# Patient Record
Sex: Male | Born: 1965 | Race: White | Hispanic: No | Marital: Married | Smoking: Never smoker
Health system: Southern US, Community
[De-identification: ages and names within clinical notes are randomized; demographics above are authoritative.]

## PROBLEM LIST (undated history)

## (undated) DIAGNOSIS — D689 Coagulation defect, unspecified: Secondary | ICD-10-CM

## (undated) DIAGNOSIS — K589 Irritable bowel syndrome without diarrhea: Secondary | ICD-10-CM

## (undated) DIAGNOSIS — I82409 Acute embolism and thrombosis of unspecified deep veins of unspecified lower extremity: Secondary | ICD-10-CM

## (undated) DIAGNOSIS — E7800 Pure hypercholesterolemia, unspecified: Secondary | ICD-10-CM

## (undated) HISTORY — DX: Coagulation defect, unspecified: D68.9

## (undated) HISTORY — DX: Irritable bowel syndrome, unspecified: K58.9

## (undated) HISTORY — DX: Pure hypercholesterolemia, unspecified: E78.00

---

## 2012-06-20 HISTORY — PX: COLONOSCOPY: SHX174

## 2013-12-05 ENCOUNTER — Other Ambulatory Visit: Payer: Self-pay | Admitting: Family Medicine

## 2013-12-05 DIAGNOSIS — R609 Edema, unspecified: Secondary | ICD-10-CM

## 2013-12-05 DIAGNOSIS — I82409 Acute embolism and thrombosis of unspecified deep veins of unspecified lower extremity: Secondary | ICD-10-CM

## 2013-12-08 ENCOUNTER — Ambulatory Visit
Admission: RE | Admit: 2013-12-08 | Discharge: 2013-12-08 | Disposition: A | Discharge status: Home / Self Care | Payer: BC Managed Care – PPO | Source: Ambulatory Visit | Attending: Family Medicine | Admitting: Family Medicine

## 2013-12-08 DIAGNOSIS — I82409 Acute embolism and thrombosis of unspecified deep veins of unspecified lower extremity: Secondary | ICD-10-CM

## 2014-10-23 ENCOUNTER — Other Ambulatory Visit: Payer: Self-pay | Admitting: Family Medicine

## 2014-10-23 DIAGNOSIS — I87001 Postthrombotic syndrome without complications of right lower extremity: Secondary | ICD-10-CM

## 2014-10-26 ENCOUNTER — Ambulatory Visit
Admission: RE | Admit: 2014-10-26 | Discharge: 2014-10-26 | Disposition: A | Discharge status: Home / Self Care | Payer: BLUE CROSS/BLUE SHIELD | Source: Ambulatory Visit | Attending: Family Medicine | Admitting: Family Medicine

## 2014-10-26 DIAGNOSIS — I87001 Postthrombotic syndrome without complications of right lower extremity: Secondary | ICD-10-CM

## 2017-11-17 ENCOUNTER — Encounter: Payer: Self-pay | Admitting: Gastroenterology

## 2018-01-03 ENCOUNTER — Encounter: Payer: Self-pay | Admitting: Gastroenterology

## 2018-01-04 ENCOUNTER — Ambulatory Visit (INDEPENDENT_AMBULATORY_CARE_PROVIDER_SITE_OTHER): Payer: BLUE CROSS/BLUE SHIELD | Admitting: Gastroenterology

## 2018-01-04 ENCOUNTER — Encounter: Payer: Self-pay | Admitting: Gastroenterology

## 2018-01-04 VITALS — BP 118/88 | HR 88 | Ht 70.0 in | Wt 208.1 lb

## 2018-01-04 DIAGNOSIS — Z86010 Personal history of colonic polyps: Secondary | ICD-10-CM

## 2018-01-04 NOTE — Progress Notes (Signed)
Chief Complaint: Follow-up for colonoscopy  Referring Provider: Dr. Alvester Morin      ASSESSMENT AND PLAN;   #1. Colorectal cancer screening/H/O adenomatous polyps. - Proceed with colonoscopy.  I have discussed the risks and benefits.  The risks including risk of perforation requiring laparotomy, bleeding after polypectomy requiring blood transfusions and risks of anesthesia/sedation were discussed.  Rare risks of missing colorectal neoplasms were also discussed.  Alternatives were given.  Patient is fully aware and agrees to proceed. All the questions were answered. Colonoscopy will be scheduled in upcoming days.  Patient is to report immediately if there is any significant weight loss or excessive bleeding until then. Consent forms were given for review.   #2.  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 Low but real risk of recurrent DVT/PE.    HPI:    Richard Meyers is a 52 y.o. male   No GI complaints History of adenomatous polyps, due for repeat colonoscopy No nausea, vomiting, heartburn, regurgitation, odynophagia or dysphagia.  No significant diarrhea (has history of IBS with diarrhea) or constipation.  There is no melena or hematochezia. No unintentional weight loss. 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  No history of PE  Past GI procedures: Colonoscopy 06/20/2012- PCF, colonic polyp status post polypectomy, biopsies tubular adenomas, small internal hemorrhoids otherwise normal colonoscopy to TI   Past Medical History:  Diagnosis Date  . Blood clotting disorder (HCC)   . Hypercholesteremia   . IBS (irritable bowel syndrome)     Past Surgical History:  Procedure Laterality Date  . COLONOSCOPY  06/20/2012   Colon polyps. Internal hemorrhoids. Tubular adenoma.     Family History  Problem Relation Age of Onset  . Heart disease Mother   . Diabetes Father   . Heart disease Father      Social History  Married to [place], son is Harrold Donath Tobacco Use  . Smoking status: Never Smoker  . Smokeless tobacco: Never Used  Substance Use Topics  . Alcohol use: Yes    Alcohol/week: 1.8 oz    Types: 3 Cans of beer per week  . Drug use: Never    Current Outpatient Medications  Medication Sig Dispense Refill  . rivaroxaban (XARELTO) 10 MG TABS tablet Take 10 mg by mouth daily.     No current facility-administered medications for this visit.     Not on File  Review of Systems:  Constitutional: Denies fever, chills, diaphoresis, appetite change and fatigue.  HEENT: Denies photophobia, eye pain, redness, hearing loss, ear pain, congestion, sore throat, rhinorrhea, sneezing, mouth sores, neck pain, neck stiffness and tinnitus.   Respiratory: Denies SOB, DOE, cough, chest tightness,  and wheezing.   Cardiovascular: Denies chest pain, palpitations and leg swelling.  Genitourinary: Denies dysuria, urgency, frequency, hematuria, flank pain and difficulty urinating.  Musculoskeletal: Denies myalgias, back pain, joint swelling, arthralgias and gait problem.  Skin: No rash.  Neurological: Denies dizziness, seizures, syncope, weakness, light-headedness, numbness and headaches.  Hematological: Denies adenopathy. Easy bruising, personal or family bleeding history  Psychiatric/Behavioral: No anxiety or depression     Physical Exam:    BP 118/88   Pulse 88   Ht 5\' 10"  (1.778 m)   Wt 208 lb 2 oz (94.4 kg)   BMI 29.86 kg/m  Filed Weights   01/04/18 1516  Weight: 208 lb 2 oz (94.4 kg)   Constitutional:  Well-developed, in no  acute distress. Psychiatric: Normal mood and affect. Behavior is normal. HEENT: Pupils normal.  Conjunctivae are normal. No scleral icterus. Neck supple.  Cardiovascular: Normal rate, regular rhythm. No edema Pulmonary/chest: Effort normal and breath sounds normal. No wheezing, rales or rhonchi. Abdominal: Soft, nondistended. Nontender. Bowel sounds  active throughout. There are no masses palpable. No hepatomegaly. Rectal:  defered Neurological: Alert and oriented to person place and time. Skin: Skin is warm and dry. No rashes noted.   Edman Circleaj Kien Mirsky, MD 01/04/2018, 3:42 PM  Cc: Dr. Alvester MorinBell

## 2018-01-04 NOTE — Patient Instructions (Signed)
If you are age 665 or older, your body mass index should be between 23-30. Your Body mass index is 29.86 kg/m. If this is out of the aforementioned range listed, please consider follow up with your Primary Care Provider.  If you are age 51 or younger, your body mass index should be between 19-25. Your Body mass index is 29.86 kg/m. If this is out of the aformentioned range listed, please consider follow up with your Primary Care Provider.   Please purchase the following medications over the counter and take as directed: Miralax  You will be contacted by our office prior to your procedure for directions on holding your Xarelto.  If you do not hear from our office 1 week prior to your scheduled procedure, please call [phone] to discuss.   Thank you,  Dr. Lynann Bolognaajesh Gupta

## 2018-01-11 ENCOUNTER — Encounter: Payer: Self-pay | Admitting: Gastroenterology

## 2018-01-18 ENCOUNTER — Telehealth: Payer: Self-pay | Admitting: Gastroenterology

## 2018-01-18 NOTE — Telephone Encounter (Signed)
He is wondering when to stop his Xarelto prior to his colonoscopy on 01/24/18.  Please advise.  Thank you.

## 2018-01-20 ENCOUNTER — Telehealth: Payer: Self-pay

## 2018-01-20 NOTE — Telephone Encounter (Signed)
1 day Please see the previous note

## 2018-01-20 NOTE — Telephone Encounter (Signed)
1 day Pl see last note

## 2018-01-20 NOTE — Telephone Encounter (Signed)
Need to advise patient when to stop Xarelto prior to his colonoscopy on 01/24/18.  Please advise.  Thank you.

## 2018-01-20 NOTE — Telephone Encounter (Signed)
I spoke with his wife and let her know that he should hold the Xarelto 1 day before the procedure.  She verbalized understanding.

## 2018-01-24 ENCOUNTER — Other Ambulatory Visit: Payer: Self-pay

## 2018-01-24 ENCOUNTER — Ambulatory Visit (AMBULATORY_SURGERY_CENTER): Payer: BLUE CROSS/BLUE SHIELD | Admitting: Gastroenterology

## 2018-01-24 ENCOUNTER — Encounter: Payer: Self-pay | Admitting: Gastroenterology

## 2018-01-24 VITALS — BP 110/84 | HR 77 | Temp 96.6°F | Resp 12 | Ht 70.0 in | Wt 208.0 lb

## 2018-01-24 DIAGNOSIS — Z86010 Personal history of colonic polyps: Secondary | ICD-10-CM

## 2018-01-24 MED ORDER — SODIUM CHLORIDE 0.9 % IV SOLN: 500.0000 mL | INTRAVENOUS | Status: AC

## 2018-01-24 NOTE — Op Note (Signed)
[facility] Patient Name: Richard Meyers Procedure Date: 01/24/2018 8:28 AM MRN: [id number] Endoscopist: Jackquline Denmark , MD Age: 52 Referring MD:  Date of Birth: 06/16/1966 Gender: Male Account #: [id number] Procedure:                Colonoscopy Indications:              High risk colon cancer surveillance: Personal                            history of colonic polyps Medicines:                Monitored Anesthesia Care Procedure:                Pre-Anesthesia Assessment:                           - Prior to the procedure, a History and Physical                            was performed, and patient medications and                            allergies were reviewed. The patient is competent.                            The risks and benefits of the procedure and the                            sedation options and risks were discussed with the                            patient. All questions were answered and informed                            consent was obtained. Patient identification and                            proposed procedure were verified by the physician                            in the procedure room. Mental Status Examination:                            alert and oriented. Airway Examination: normal                            oropharyngeal airway and neck mobility. Respiratory                            Examination: clear to auscultation. Prophylactic                            Antibiotics: The patient does not require  prophylactic antibiotics. Prior Anticoagulants: The                            patient has taken Xarelto (rivaroxaban), last dose                            was 2 days prior to procedure. ASA Grade                            Assessment: II - A patient with mild systemic                            disease. After reviewing the risks and benefits,                            the patient was deemed in satisfactory condition to                             undergo the procedure. The anesthesia plan was to                            use monitored anesthesia care (MAC). Immediately                            prior to administration of medications, the patient                            was re-assessed for adequacy to receive sedatives.                            The heart rate, respiratory rate, oxygen                            saturations, blood pressure, adequacy of pulmonary                            ventilation, and response to care were monitored                            throughout the procedure. The physical status of                            the patient was re-assessed after the procedure.                           After obtaining informed consent, the colonoscope                            was passed under direct vision. Throughout the                            procedure, the patient's blood pressure, pulse, and  oxygen saturations were monitored continuously. The                            Colonoscope was introduced through the anus and                            advanced to the the cecum, identified by                            appendiceal orifice and ileocecal valve. The                            colonoscopy was performed without difficulty. The                            patient tolerated the procedure well. The quality                            of the bowel preparation was good. Scope In: 8:36:00 AM Scope Out: 8:45:44 AM Scope Withdrawal Time: 0 hours 7 minutes 14 seconds  Total Procedure Duration: 0 hours 9 minutes 44 seconds  Findings:                 A few rare small-mouthed diverticula were found in                            the sigmoid colon.                           Non-bleeding internal hemorrhoids were found. The                            hemorrhoids were small.                           The exam was otherwise without abnormality on                             direct and retroflexion views. Complications:            No immediate complications. Estimated Blood Loss:     Estimated blood loss: none. Impression:               - Minimal sigmoid diverticulosis                           - Non-bleeding internal hemorrhoids.                           - Otherwise normal colonoscopy to terminal ileum. Recommendation:           - Patient has a contact number available for                            emergencies. The signs and symptoms of potential  delayed complications were discussed with the                            patient. Return to normal activities tomorrow.                            Written discharge instructions were provided to the                            patient.                           - Resume previous diet.                           - Continue present medications.                           - Resume Xarelto (rivaroxaban) at prior dose today.                           - Repeat colonoscopy in 5 years for surveillance                            due to previous history of adenomatous polyps.                           - Return to GI clinic PRN. Jackquline Denmark, MD 01/24/2018 8:52:34 AM This report has been signed electronically.

## 2018-01-24 NOTE — Patient Instructions (Signed)
YOU HAD AN ENDOSCOPIC PROCEDURE TODAY AT THE [facility]:   Refer to the procedure report that was given to you for any specific questions about what was found during the examination.  If the procedure report does not answer your questions, please call your gastroenterologist to clarify.  If you requested that your care partner not be given the details of your procedure findings, then the procedure report has been included in a sealed envelope for you to review at your convenience later.  YOU SHOULD EXPECT: Some feelings of bloating in the abdomen. Passage of more gas than usual.  Walking can help get rid of the air that was put into your GI tract during the procedure and reduce the bloating. If you had a lower endoscopy (such as a colonoscopy or flexible sigmoidoscopy) you may notice spotting of blood in your stool or on the toilet paper. If you underwent a bowel prep for your procedure, you may not have a normal bowel movement for a few days.  Please Note:  You might notice some irritation and congestion in your nose or some drainage.  This is from the oxygen used during your procedure.  There is no need for concern and it should clear up in a day or so.  SYMPTOMS TO REPORT IMMEDIATELY:   Following lower endoscopy (colonoscopy or flexible sigmoidoscopy):  Excessive amounts of blood in the stool  Significant tenderness or worsening of abdominal pains  Swelling of the abdomen that is new, acute  Fever of 100F or higher  For urgent or emergent issues, a gastroenterologist can be reached at any hour by calling [phone].   DIET:  We do recommend a small meal at first, but then you may proceed to your regular diet.  Drink plenty of fluids but you should avoid alcoholic beverages for 24 hours.  ACTIVITY:  You should plan to take it easy for the rest of today and you should NOT DRIVE or use heavy machinery until tomorrow (because of the sedation medicines used during the test).     FOLLOW UP: Our staff will call the number listed on your records the next business day following your procedure to check on you and address any questions or concerns that you may have regarding the information given to you following your procedure. If we do not reach you, we will leave a message.  However, if you are feeling well and you are not experiencing any problems, there is no need to return our call.  We will assume that you have returned to your regular daily activities without incident.  If any biopsies were taken you will be contacted by phone or by letter within the next 1-3 weeks.  Please call us at [phone] if you have not heard about the biopsies in 3 weeks.    SIGNATURES/CONFIDENTIALITY: You and/or your care partner have signed paperwork which will be entered into your electronic medical record.  These signatures attest to the fact that that the information above on your After Visit Summary has been reviewed and is understood.  Full responsibility of the confidentiality of this discharge information lies with you and/or your care-partner. 

## 2018-01-24 NOTE — Progress Notes (Signed)
Report given to PACU, vss 

## 2018-01-25 ENCOUNTER — Telehealth: Payer: Self-pay | Admitting: *Deleted

## 2018-01-25 ENCOUNTER — Telehealth: Payer: Self-pay

## 2018-01-25 NOTE — Telephone Encounter (Signed)
Left message on answering machine. 

## 2018-01-25 NOTE — Telephone Encounter (Signed)
  Follow up Call-  Call back number 01/24/2018  Post procedure Call Back phone  # [phone]  Permission to leave phone message Yes  Some recent data might be hidden     Patient questions:  Do you have a fever, pain , or abdominal swelling? No. Pain Score  0 *  Have you tolerated food without any problems? Yes.    Have you been able to return to your normal activities? Yes.    Do you have any questions about your discharge instructions: Diet   No. Medications  No. Follow up visit  No.  Do you have questions or concerns about your Care? No.  Actions: * If pain score is 4 or above: No action needed, pain <4.

## 2020-07-11 ENCOUNTER — Ambulatory Visit: Payer: Self-pay

## 2020-07-11 ENCOUNTER — Other Ambulatory Visit: Payer: Self-pay

## 2020-07-11 ENCOUNTER — Other Ambulatory Visit: Payer: Self-pay | Admitting: Family Medicine

## 2020-07-11 DIAGNOSIS — M542 Cervicalgia: Secondary | ICD-10-CM
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
## 2022-11-20 ENCOUNTER — Encounter: Payer: Self-pay | Admitting: Gastroenterology

## 2023-02-18 ENCOUNTER — Ambulatory Visit: Payer: BC Managed Care – PPO | Admitting: Gastroenterology

## 2023-03-09 ENCOUNTER — Ambulatory Visit: Payer: BC Managed Care – PPO | Admitting: Gastroenterology

## 2023-05-10 ENCOUNTER — Ambulatory Visit: Payer: BC Managed Care – PPO | Admitting: Gastroenterology

## 2023-06-02 ENCOUNTER — Ambulatory Visit: Payer: BC Managed Care – PPO | Admitting: Gastroenterology

## 2023-08-17 ENCOUNTER — Ambulatory Visit (INDEPENDENT_AMBULATORY_CARE_PROVIDER_SITE_OTHER): Payer: BC Managed Care – PPO | Admitting: Gastroenterology

## 2023-08-17 ENCOUNTER — Encounter: Payer: Self-pay | Admitting: Gastroenterology

## 2023-08-17 VITALS — BP 104/80 | HR 82 | Ht 70.0 in | Wt 205.0 lb

## 2023-08-17 DIAGNOSIS — Z86718 Personal history of other venous thrombosis and embolism: Secondary | ICD-10-CM | POA: Diagnosis not present

## 2023-08-17 DIAGNOSIS — Z860101 Personal history of adenomatous and serrated colon polyps: Secondary | ICD-10-CM

## 2023-08-17 DIAGNOSIS — Z7901 Long term (current) use of anticoagulants: Secondary | ICD-10-CM | POA: Diagnosis not present

## 2023-08-17 DIAGNOSIS — Z1211 Encounter for screening for malignant neoplasm of colon: Secondary | ICD-10-CM

## 2023-08-17 MED ORDER — NA SULFATE-K SULFATE-MG SULF 17.5-3.13-1.6 GM/177ML PO SOLN: 1.0000 | Freq: Once | ORAL | 0 refills | Status: AC

## 2023-08-17 NOTE — Progress Notes (Signed)
 Chief Complaint: Follow-up for colonoscopy  Referring Provider: Dr. Carolee      ASSESSMENT AND PLAN;   #1. Colorectal cancer screening/H/O adenomatous polyps. - Colon (any Monday)   #2.  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 Low but real risk of recurrent DVT/PE.   Discussed risks & benefits of colonoscopy. Risks including rare perforation req laparotomy, bleeding after bx/polypectomy req blood transfusion, rarely missing neoplasms, risks of anesthesia/sedation, rare risk of damage to internal organs. Benefits outweigh the risks. Patient agrees to proceed. All the questions were answered. Pt consents to proceed.  HPI:    Richard Meyers is a 58 y.o. male   No GI complaints History of adenomatous polyps, due for repeat colonoscopy No nausea, vomiting, heartburn, regurgitation, odynophagia or dysphagia.  No significant diarrhea (has history of IBS with diarrhea) or constipation.  There is no melena or hematochezia. No unintentional weight loss. 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  No history of PE  Past GI procedures: Colonoscopy 06/20/2012- PCF, colonic polyp status post polypectomy, biopsies tubular adenomas, small internal hemorrhoids otherwise normal colonoscopy to TI Colonoscopy 01/2018: neg   Past Medical History:  Diagnosis Date   Blood clotting disorder (HCC)    Hypercholesteremia    IBS (irritable bowel syndrome)     Past Surgical History:  Procedure Laterality Date   COLONOSCOPY  06/20/2012   Colon polyps. Internal hemorrhoids. Tubular adenoma.     Family History  Problem Relation Age of Onset   Heart disease Mother    Diabetes Father    Heart disease Father     Social History  Married to [place], son is [place] Tobacco Use   Smoking status: Never Smoker   Smokeless tobacco: Never Used  Substance Use Topics   Alcohol use: Yes    Alcohol/week: 1.8 oz    Types: 3  Cans of beer per week   Drug use: Never    Current Outpatient Medications  Medication Sig Dispense Refill   rivaroxaban (XARELTO) 10 MG TABS tablet Take 10 mg by mouth daily.     Current Facility-Administered Medications  Medication Dose Route Frequency Provider Last Rate Last Admin   0.9 %  sodium chloride  infusion  500 mL Intravenous Continuous Charlanne Groom, MD        No Known Allergies  Review of Systems:  Constitutional: Denies fever, chills, diaphoresis, appetite change and fatigue.  HEENT: Denies photophobia, eye pain, redness, hearing loss, ear pain, congestion, sore throat, rhinorrhea, sneezing, mouth sores, neck pain, neck stiffness and tinnitus.   Respiratory: Denies SOB, DOE, cough, chest tightness,  and wheezing.   Cardiovascular: Denies chest pain, palpitations and leg swelling.  Genitourinary: Denies dysuria, urgency, frequency, hematuria, flank pain and difficulty urinating.  Musculoskeletal: Denies myalgias, back pain, joint swelling, arthralgias and gait problem.  Skin: No rash.  Neurological: Denies dizziness, seizures, syncope, weakness, light-headedness, numbness and headaches.  Hematological: Denies adenopathy. Easy bruising, personal or family bleeding history  Psychiatric/Behavioral: No anxiety or depression     Physical Exam:    BP 104/80   Pulse 82   Ht 5' 10 (1.778 m)   Wt 205 lb (93 kg)   BMI 29.41 kg/m  Filed Weights   08/17/23 1521  Weight: 205 lb (93 kg)   Constitutional:  Well-developed, in no acute distress. Psychiatric: Normal mood and affect. Behavior is normal. HEENT: Pupils normal.  Conjunctivae are  normal. No scleral icterus. Neck supple.  Cardiovascular: Normal rate, regular rhythm. No edema Pulmonary/chest: Effort normal and breath sounds normal. No wheezing, rales or rhonchi. Abdominal: Soft, nondistended. Nontender. Bowel sounds active throughout. There are no masses palpable. No hepatomegaly. Rectal:  defered Neurological:  Alert and oriented to person place and time. Skin: Skin is warm and dry. No rashes noted.   Anselm Bring, MD 08/17/2023, 3:26 PM  Cc: Dr. Carolee

## 2023-08-17 NOTE — Patient Instructions (Addendum)
 _______________________________________________________  If your blood pressure at your visit was 140/90 or greater, please contact your primary care physician to follow up on this.  _______________________________________________________  If you are age 57 or older, your body mass index should be between 23-30. Your Body mass index is 29.41 kg/m. If this is out of the aforementioned range listed, please consider follow up with your Primary Care Provider.  If you are age 54 or younger, your body mass index should be between 19-25. Your Body mass index is 29.41 kg/m. If this is out of the aformentioned range listed, please consider follow up with your Primary Care Provider.   ________________________________________________________  The [facility] providers would like to encourage you to use MYCHART to communicate with providers for non-urgent requests or questions.  Due to long hold times on the telephone, sending your provider a message by [facility] may be a faster and more efficient way to get a response.  Please allow 48 business hours for a response.  Please remember that this is for non-urgent requests.  _______________________________________________________  We have sent the following medications to your pharmacy for you to pick up at your convenience:    You have been scheduled for a colonoscopy. Please follow written instructions given to you at your visit today.   Please pick up your prep supplies at the pharmacy within the next 1-3 days.  If you use inhalers (even only as needed), please bring them with you on the day of your procedure.  DO NOT TAKE 7 DAYS PRIOR TO TEST- Trulicity (dulaglutide) Ozempic, Wegovy (semaglutide) Mounjaro (tirzepatide) Bydureon Bcise (exanatide extended release)  DO NOT TAKE 1 DAY PRIOR TO YOUR TEST Rybelsus (semaglutide) Adlyxin (lixisenatide) Victoza (liraglutide) Byetta  (exanatide) ___________________________________________________________________________  Thank you,  Dr. Lynnie Bring

## 2023-09-22 ENCOUNTER — Telehealth: Payer: Self-pay

## 2023-09-22 NOTE — Telephone Encounter (Signed)
Dr Alvester Morin gave approval to hold xarelto 2 days prior to the procedure in march and patient voiced understanding when I told him. Paper sent to be scanned

## 2023-10-17 ENCOUNTER — Encounter (HOSPITAL_BASED_OUTPATIENT_CLINIC_OR_DEPARTMENT_OTHER): Payer: Self-pay

## 2023-10-17 ENCOUNTER — Emergency Department (HOSPITAL_BASED_OUTPATIENT_CLINIC_OR_DEPARTMENT_OTHER)
Admission: EM | Admit: 2023-10-17 | Discharge: 2023-10-17 | Disposition: A | Discharge status: Home / Self Care | Attending: Emergency Medicine | Admitting: Emergency Medicine

## 2023-10-17 ENCOUNTER — Other Ambulatory Visit: Payer: Self-pay

## 2023-10-17 ENCOUNTER — Emergency Department (HOSPITAL_BASED_OUTPATIENT_CLINIC_OR_DEPARTMENT_OTHER)

## 2023-10-17 DIAGNOSIS — I82409 Acute embolism and thrombosis of unspecified deep veins of unspecified lower extremity: Secondary | ICD-10-CM

## 2023-10-17 DIAGNOSIS — S63287A Dislocation of proximal interphalangeal joint of left little finger, initial encounter: Secondary | ICD-10-CM | POA: Diagnosis not present

## 2023-10-17 DIAGNOSIS — Y92410 Unspecified street and highway as the place of occurrence of the external cause: Secondary | ICD-10-CM | POA: Diagnosis not present

## 2023-10-17 DIAGNOSIS — S61227A Laceration with foreign body of left little finger without damage to nail, initial encounter: Secondary | ICD-10-CM | POA: Diagnosis not present

## 2023-10-17 DIAGNOSIS — Z23 Encounter for immunization: Secondary | ICD-10-CM | POA: Diagnosis not present

## 2023-10-17 DIAGNOSIS — Z7901 Long term (current) use of anticoagulants: Secondary | ICD-10-CM | POA: Diagnosis not present

## 2023-10-17 DIAGNOSIS — S63259A Unspecified dislocation of unspecified finger, initial encounter: Secondary | ICD-10-CM

## 2023-10-17 DIAGNOSIS — S6992XA Unspecified injury of left wrist, hand and finger(s), initial encounter: Secondary | ICD-10-CM | POA: Diagnosis present

## 2023-10-17 HISTORY — DX: Acute embolism and thrombosis of unspecified deep veins of unspecified lower extremity: I82.409

## 2023-10-17 MED ORDER — CEPHALEXIN 500 MG PO CAPS: 500.0000 mg | ORAL_CAPSULE | Freq: Two times a day (BID) | ORAL | 0 refills | Status: AC

## 2023-10-17 MED ORDER — TETANUS-DIPHTH-ACELL PERTUSSIS 5-2.5-18.5 LF-MCG/0.5 IM SUSY
PREFILLED_SYRINGE | INTRAMUSCULAR | Status: AC
  Administered 2023-10-17: 0.5 mL via INTRAMUSCULAR
  Filled 2023-10-17: qty 0.5

## 2023-10-17 MED ORDER — LIDOCAINE HCL (PF) 1 % IJ SOLN
5.0000 mL | Freq: Once | INTRAMUSCULAR | Status: AC
  Administered 2023-10-17: 5 mL
  Filled 2023-10-17: qty 5

## 2023-10-17 MED ORDER — TETANUS-DIPHTH-ACELL PERTUSSIS 5-2.5-18.5 LF-MCG/0.5 IM SUSY: 0.5000 mL | PREFILLED_SYRINGE | Freq: Once | INTRAMUSCULAR | Status: AC

## 2023-10-17 MED ORDER — CEPHALEXIN 250 MG PO CAPS
500.0000 mg | ORAL_CAPSULE | Freq: Once | ORAL | Status: AC
  Administered 2023-10-17: 500 mg via ORAL
  Filled 2023-10-17: qty 2
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
## 2023-10-17 NOTE — Discharge Instructions (Addendum)
 Today you were seen for a left pinky finger laceration and dislocation.  Please follow-up with Dr. Aundria Rud with hand surgery for further evaluation and treatment.  Please follow-up with your PCP in approximately 10 days for evaluation of wound healing and suture removal.  You may alternate taking Tylenol/Motrin as needed for pain.  Look out for signs of infection to include pus-like discharge, redness with streaking, fever, chills, etc. Thank you for letting us treat you today. After forming a physical exam and reviewing your imaging, I feel you are safe to go home. Please follow up with your PCP in the next several days and provide them with your records from this visit. Return to the Emergency Room if pain becomes severe or symptoms worsen.

## 2023-10-21 ENCOUNTER — Encounter: Payer: Self-pay | Admitting: Gastroenterology

## 2023-11-01 ENCOUNTER — Encounter: Payer: Self-pay | Admitting: Gastroenterology

## 2023-11-01 ENCOUNTER — Ambulatory Visit (AMBULATORY_SURGERY_CENTER): Payer: BC Managed Care – PPO | Admitting: Gastroenterology

## 2023-11-01 VITALS — BP 114/76 | HR 85 | Temp 97.3°F | Resp 12 | Ht 70.0 in | Wt 205.0 lb

## 2023-11-01 DIAGNOSIS — K5730 Diverticulosis of large intestine without perforation or abscess without bleeding: Secondary | ICD-10-CM | POA: Diagnosis not present

## 2023-11-01 DIAGNOSIS — Z1211 Encounter for screening for malignant neoplasm of colon: Secondary | ICD-10-CM | POA: Diagnosis present

## 2023-11-01 DIAGNOSIS — K5140 Inflammatory polyps of colon without complications: Secondary | ICD-10-CM | POA: Diagnosis not present

## 2023-11-01 DIAGNOSIS — K640 First degree hemorrhoids: Secondary | ICD-10-CM | POA: Diagnosis not present

## 2023-11-01 DIAGNOSIS — Z860101 Personal history of adenomatous and serrated colon polyps: Secondary | ICD-10-CM

## 2023-11-01 DIAGNOSIS — D125 Benign neoplasm of sigmoid colon: Secondary | ICD-10-CM

## 2023-11-01 MED ORDER — SODIUM CHLORIDE 0.9 % IV SOLN: 500.0000 mL | Freq: Once | INTRAVENOUS | Status: DC

## 2023-11-01 NOTE — Patient Instructions (Addendum)
-  Handout on polyps, diverticulosis and hemorrhoids provided -await pathology results -repeat colonoscopy for surveillance recommended. Date to be determined when pathology result become available   -Continue present medications -Resume Xarelto 4/2    YOU HAD AN ENDOSCOPIC PROCEDURE TODAY AT THE [facility]:   Refer to the procedure report that was given to you for any specific questions about what was found during the examination.  If the procedure report does not answer your questions, please call your gastroenterologist to clarify.  If you requested that your care partner not be given the details of your procedure findings, then the procedure report has been included in a sealed envelope for you to review at your convenience later.  YOU SHOULD EXPECT: Some feelings of bloating in the abdomen. Passage of more gas than usual.  Walking can help get rid of the air that was put into your GI tract during the procedure and reduce the bloating. If you had a lower endoscopy (such as a colonoscopy or flexible sigmoidoscopy) you may notice spotting of blood in your stool or on the toilet paper. If you underwent a bowel prep for your procedure, you may not have a normal bowel movement for a few days.  Please Note:  You might notice some irritation and congestion in your nose or some drainage.  This is from the oxygen used during your procedure.  There is no need for concern and it should clear up in a day or so.  SYMPTOMS TO REPORT IMMEDIATELY:  Following lower endoscopy (colonoscopy or flexible sigmoidoscopy):  Excessive amounts of blood in the stool  Significant tenderness or worsening of abdominal pains  Swelling of the abdomen that is new, acute  Fever of 100F or higher  For urgent or emergent issues, a gastroenterologist can be reached at any hour by calling [phone]. Do not use MyChart messaging for urgent concerns.    DIET:  We do recommend a small meal at first, but then you  may proceed to your regular diet.  Drink plenty of fluids but you should avoid alcoholic beverages for 24 hours.  ACTIVITY:  You should plan to take it easy for the rest of today and you should NOT DRIVE or use heavy machinery until tomorrow (because of the sedation medicines used during the test).    FOLLOW UP: Our staff will call the number listed on your records the next business day following your procedure.  We will call around 7:15- 8:00 am to check on you and address any questions or concerns that you may have regarding the information given to you following your procedure. If we do not reach you, we will leave a message.     If any biopsies were taken you will be contacted by phone or by letter within the next 1-3 weeks.  Please call us at [phone] if you have not heard about the biopsies in 3 weeks.    SIGNATURES/CONFIDENTIALITY: You and/or your care partner have signed paperwork which will be entered into your electronic medical record.  These signatures attest to the fact that that the information above on your After Visit Summary has been reviewed and is understood.  Full responsibility of the confidentiality of this discharge information lies with you and/or your care-partner.

## 2023-11-01 NOTE — Progress Notes (Signed)
 Pt's states no medical or surgical changes since previsit or office visit.

## 2023-11-01 NOTE — Progress Notes (Signed)
 Sedate, gd SR, tolerated procedure well, VSS, report to RN

## 2023-11-01 NOTE — Progress Notes (Signed)
 [facility] History and Physical   Primary Care Physician:  Kendell Bane, MD   Reason for Procedure:   H/O polyps  Plan:    colon     HPI: Richard Meyers is a 58 y.o. male    Past Medical History:  Diagnosis Date   Blood clotting disorder (HCC)    DVT (deep venous thrombosis) (HCC)    Hypercholesteremia    IBS (irritable bowel syndrome)     Past Surgical History:  Procedure Laterality Date   COLONOSCOPY  06/20/2012   Colon polyps. Internal hemorrhoids. Tubular adenoma.     Prior to Admission medications   Medication Sig Start Date End Date Taking? Authorizing Provider  rivaroxaban (XARELTO) 10 MG TABS tablet Take 10 mg by mouth daily.   Yes [provider]    Current Outpatient Medications  Medication Sig Dispense Refill   rivaroxaban (XARELTO) 10 MG TABS tablet Take 10 mg by mouth daily.     Current Facility-Administered Medications  Medication Dose Route Frequency Provider Last Rate Last Admin   0.9 %  sodium chloride infusion  500 mL Intravenous Continuous Lynann Bologna, MD       0.9 %  sodium chloride infusion  500 mL Intravenous Once Lynann Bologna, MD        Allergies as of 11/01/2023   (No Known Allergies)    Family History  Problem Relation Age of Onset   Heart disease Mother    Diabetes Father    Heart disease Father    Colon cancer Neg Hx    Esophageal cancer Neg Hx    Rectal cancer Neg Hx    Stomach cancer Neg Hx     Social History   Socioeconomic History   Marital status: Married    Spouse name: Not on file   Number of children: Not on file   Years of education: Not on file   Highest education level: Not on file  Occupational History   Not on file  Tobacco Use   Smoking status: Never   Smokeless tobacco: Never  Vaping Use   Vaping status: Never Used  Substance and Sexual Activity   Alcohol use: Yes    Alcohol/week: 3.0 standard drinks of alcohol    Types: 3 Cans of beer per week   Drug use: Never   Sexual  activity: Not on file  Other Topics Concern   Not on file  Social History Narrative   Not on file   Social Drivers of Health   Financial Resource Strain: Not on file  Food Insecurity: No Food Insecurity (02/24/2021)   Received from [facility], [facility]   Hunger Vital Sign    Worried About Running Out of Food in the Last Year: Never true    Ran Out of Food in the Last Year: Never true  Transportation Needs: Not on file  Physical Activity: Not on file  Stress: Not on file  Social Connections: Unknown (12/16/2021)   Received from [facility], [facility]   Social Network    Social Network: Not on file  Intimate Partner Violence: Unknown (11/07/2021)   Received from [facility], [facility]   HITS    Physically Hurt: Not on file    Insult or Talk Down To: Not on file    Threaten Physical Harm: Not on file    Scream or Curse: Not on file    Review of Systems: Positive for none All other review of systems negative except as mentioned in  the HPI.  Physical Exam: Vital signs in last 24 hours: @VSRANGES @   General:   Alert,  Well-developed, well-nourished, pleasant and cooperative in NAD Lungs:  Clear throughout to auscultation.   Heart:  Regular rate and rhythm; no murmurs, clicks, rubs,  or gallops. Abdomen:  Soft, nontender and nondistended. Normal bowel sounds.   Neuro/Psych:  Alert and cooperative. Normal mood and affect. A and O x 3    No significant changes were identified.  The patient continues to be an appropriate candidate for the planned procedure and anesthesia.   Edman Circle, MD. [facility] 11/01/2023 7:58 AM@

## 2023-11-01 NOTE — Progress Notes (Signed)
 Called to room to assist during endoscopic procedure.  Patient ID and intended procedure confirmed with present staff. Received instructions for my participation in the procedure from the performing physician.

## 2023-11-01 NOTE — Op Note (Signed)
 [facility] Patient Name: Harles Evetts Procedure Date: 11/01/2023 7:49 AM MRN: [id number] Endoscopist: Lynann Bologna , MD, [id number] Age: 58 Referring MD:  Date of Birth: 06/04/1966 Gender: Male Account #: [id number] Procedure:                Colonoscopy Indications:              High risk colon cancer surveillance: Personal                            history of colonic polyps Medicines:                Monitored Anesthesia Care Procedure:                Pre-Anesthesia Assessment:                           - Prior to the procedure, a History and Physical                            was performed, and patient medications and                            allergies were reviewed. The patient's tolerance of                            previous anesthesia was also reviewed. The risks                            and benefits of the procedure and the sedation                            options and risks were discussed with the patient.                            All questions were answered, and informed consent                            was obtained. Prior Anticoagulants: Xarelto which                            was held 3 days prior. ASA Grade Assessment: II - A                            patient with mild systemic disease. After reviewing                            the risks and benefits, the patient was deemed in                            satisfactory condition to undergo the procedure.                           After obtaining informed consent, the colonoscope  was passed under direct vision. Throughout the                            procedure, the patient's blood pressure, pulse, and                            oxygen saturations were monitored continuously. The                            CF [id number] #[id number] was introduced through the anus                            and advanced to the 2 cm into the ileum. The                            colonoscopy was  performed without difficulty. The                            patient tolerated the procedure well. The quality                            of the bowel preparation was good. The terminal                            ileum, ileocecal valve, appendiceal orifice, and                            rectum were photographed. Scope In: 8:02:20 AM Scope Out: 8:14:20 AM Scope Withdrawal Time: 0 hours 9 minutes 33 seconds  Total Procedure Duration: 0 hours 12 minutes 0 seconds  Findings:                 A 6 mm polyp was found in the distal sigmoid colon.                            The polyp was sessile. The polyp was removed with a                            cold snare. Resection and retrieval were complete.                           A few rare small-mouthed diverticula were found in                            the sigmoid colon.                           Non-bleeding internal hemorrhoids were found during                            retroflexion. The hemorrhoids were small and Grade                            I (internal hemorrhoids that  do not prolapse).                           The terminal ileum appeared normal.                           The exam was otherwise without abnormality on                            direct and retroflexion views. Complications:            No immediate complications. Estimated Blood Loss:     Estimated blood loss: none. Impression:               - One 6 mm polyp in the distal sigmoid colon,                            removed with a cold snare. Resected and retrieved.                           - Very minimal sigmoid diverticulosis.                           - Non-bleeding internal hemorrhoids.                           - The examined portion of the ileum was normal.                           - The examination was otherwise normal on direct                            and retroflexion views. Recommendation:           - Patient has a contact number available for                             emergencies. The signs and symptoms of potential                            delayed complications were discussed with the                            patient. Return to normal activities tomorrow.                            Written discharge instructions were provided to the                            patient.                           - Resume previous diet.                           - Continue present medications.                           -  Await pathology results.                           - Repeat colonoscopy for surveillance based on                            pathology results.                           - Resume Xarelto 4/2                           - The findings and recommendations were discussed                            with the patient's family. Lynann Bologna, MD 11/01/2023 8:19:59 AM This report has been signed electronically.

## 2023-11-02 ENCOUNTER — Telehealth: Payer: Self-pay

## 2023-11-02 NOTE — Telephone Encounter (Signed)
 Left message on answering machine.

## 2023-11-03 LAB — SURGICAL PATHOLOGY

## 2023-11-09 ENCOUNTER — Encounter: Payer: Self-pay | Admitting: Gastroenterology
